# Patient Record
Sex: Male | Born: 1995 | Race: White | Hispanic: No | Marital: Single
Health system: Southern US, Community
[De-identification: ages and names within clinical notes are randomized; demographics above are authoritative.]

---
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
## 2015-07-16 ENCOUNTER — Ambulatory Visit
Admission: RE | Admit: 2015-07-16 | Discharge: 2015-07-16 | Disposition: A | Discharge status: Home / Self Care | Payer: Managed Care, Other (non HMO) | Source: Ambulatory Visit | Attending: Family Medicine | Admitting: Family Medicine

## 2015-07-16 ENCOUNTER — Other Ambulatory Visit: Payer: Self-pay | Admitting: Family Medicine

## 2015-07-16 DIAGNOSIS — M79645 Pain in left finger(s): Secondary | ICD-10-CM | POA: Insufficient documentation

## 2015-07-16 DIAGNOSIS — R609 Edema, unspecified: Secondary | ICD-10-CM

## 2015-07-16 DIAGNOSIS — R52 Pain, unspecified: Secondary | ICD-10-CM

## 2015-07-23 DIAGNOSIS — S62301A Unspecified fracture of second metacarpal bone, left hand, initial encounter for closed fracture: Secondary | ICD-10-CM | POA: Diagnosis not present

## 2016-02-15 ENCOUNTER — Emergency Department
Admission: EM | Admit: 2016-02-15 | Discharge: 2016-02-15 | Disposition: A | Discharge status: Home / Self Care | Payer: Managed Care, Other (non HMO) | Attending: Emergency Medicine | Admitting: Emergency Medicine

## 2016-02-15 ENCOUNTER — Emergency Department: Payer: Managed Care, Other (non HMO)

## 2016-02-15 DIAGNOSIS — X501XXA Overexertion from prolonged static or awkward postures, initial encounter: Secondary | ICD-10-CM | POA: Insufficient documentation

## 2016-02-15 DIAGNOSIS — S93402A Sprain of unspecified ligament of left ankle, initial encounter: Secondary | ICD-10-CM

## 2016-02-15 DIAGNOSIS — M25572 Pain in left ankle and joints of left foot: Secondary | ICD-10-CM | POA: Diagnosis present

## 2016-02-15 DIAGNOSIS — Y9362 Activity, american flag or touch football: Secondary | ICD-10-CM | POA: Diagnosis not present

## 2016-02-15 DIAGNOSIS — Y999 Unspecified external cause status: Secondary | ICD-10-CM | POA: Diagnosis not present

## 2016-02-15 DIAGNOSIS — Y929 Unspecified place or not applicable: Secondary | ICD-10-CM | POA: Diagnosis not present

## 2016-02-15 MED ORDER — OXYCODONE-ACETAMINOPHEN 5-325 MG PO TABS
1.0000 | ORAL_TABLET | Freq: Once | ORAL | Status: AC
  Administered 2016-02-15: 1 via ORAL
  Filled 2016-02-15: qty 1

## 2016-02-15 MED ORDER — IBUPROFEN 800 MG PO TABS: 800.0000 mg | ORAL_TABLET | Freq: Three times a day (TID) | ORAL | Status: AC | PRN

## 2016-02-15 MED ORDER — TRAMADOL HCL 50 MG PO TABS: 50.0000 mg | ORAL_TABLET | Freq: Four times a day (QID) | ORAL | Status: AC | PRN

## 2016-02-15 MED ORDER — IBUPROFEN 800 MG PO TABS
800.0000 mg | ORAL_TABLET | Freq: Once | ORAL | Status: AC
  Administered 2016-02-15: 800 mg via ORAL
  Filled 2016-02-15: qty 1

## 2016-02-15 NOTE — ED Notes (Signed)
Playing football and injured left ankle swelling noted

## 2016-02-15 NOTE — Discharge Instructions (Signed)
Wear splint and ambulate with crutches for 3-5 days as needed. Ankle Sprain An ankle sprain is an injury to the strong, fibrous tissues (ligaments) that hold your ankle bones together.  HOME CARE   Put ice on your ankle for 1-2 days or as told by your doctor.  Put ice in a plastic bag.  Place a towel between your skin and the bag.  Leave the ice on for 15-20 minutes at a time, every 2 hours while you are awake.  Only take medicine as told by your doctor.  Raise (elevate) your injured ankle above the level of your heart as much as possible for 2-3 days.  Use crutches if your doctor tells you to. Slowly put your own weight on the affected ankle. Use the crutches until you can walk without pain.  If you have a plaster splint:  Do not rest it on anything harder than a pillow for 24 hours.  Do not put weight on it.  Do not get it wet.  Take it off to shower or bathe.  If given, use an elastic wrap or support stocking for support. Take the wrap off if your toes lose feeling (numb), tingle, or turn cold or blue.  If you have an air splint:  Add or let out air to make it comfortable.  Take it off at night and to shower and bathe.  Wiggle your toes and move your ankle up and down often while you are wearing it. GET HELP IF:  You have rapidly increasing bruising or puffiness (swelling).  Your toes feel very cold.  You lose feeling in your foot.  Your medicine does not help your pain. GET HELP RIGHT AWAY IF:   Your toes lose feeling (numb) or turn blue.  You have severe pain that is increasing. MAKE SURE YOU:   Understand these instructions.  Will watch your condition.  Will get help right away if you are not doing well or get worse.   This information is not intended to replace advice given to you by your health care provider. Make sure you discuss any questions you have with your health care provider.   Document Released: 04/13/2008 Document Revised: 11/16/2014  Document Reviewed: 05/09/2012 Elsevier Interactive Patient Education [employer]2016 [employer] Inc[employer]

## 2016-02-15 NOTE — ED Provider Notes (Signed)
[facility] Emergency Department Provider Note  ____________________________________________  Time seen: Approximately 7:46 PM  I have reviewed the triage vital signs and the nursing notes.   HISTORY  Chief Complaint Ankle Pain    HPI Michael Boyer is a 20 y.o. male patient complaining of left ankle pain and edema secondary plan football. Patient states a twisting incident resulting in immediate pain and edema. Patient states pain increase with weightbearing. No palliative measures around the triage which time he was given an ice pack. No past medical history on file.  There are no active problems to display for this patient.   No past surgical history on file.  Current Outpatient Rx  Name  Route  Sig  Dispense  Refill  . ibuprofen (ADVIL,MOTRIN) 800 MG tablet   Oral   Take 1 tablet (800 mg total) by mouth every 8 (eight) hours as needed.   30 tablet   0   . traMADol (ULTRAM) 50 MG tablet   Oral   Take 1 tablet (50 mg total) by mouth every 6 (six) hours as needed.   20 tablet   0     Allergies Review of patient's allergies indicates no known allergies.  No family history on file.  Social History Social History  Substance Use Topics  . Smoking status: Not on file  . Smokeless tobacco: Not on file  . Alcohol Use: Not on file    Review of Systems Constitutional: No fever/chills Eyes: No visual changes. ENT: No sore throat. Cardiovascular: Denies chest pain. Respiratory: Denies shortness of breath. Gastrointestinal: No abdominal pain.  No nausea, no vomiting.  No diarrhea.  No constipation. Genitourinary: Negative for dysuria. Musculoskeletal: Left lateral ankle pain and edema  Skin: Negative for rash. Neurological: Negative for headaches, focal weakness or numbness.    ____________________________________________   PHYSICAL EXAM:  VITAL SIGNS: ED Triage Vitals  Enc Vitals Group     BP 02/15/16 1852 120/69 mmHg     Pulse  Rate 02/15/16 1852 80     Resp 02/15/16 1852 18     Temp 02/15/16 1852 98.3 F (36.8 C)     Temp Source 02/15/16 1852 Oral     SpO2 02/15/16 1852 98 %     Weight 02/15/16 1852 210 lb (95.255 kg)     Height 02/15/16 1852 6\' 2"  (1.88 m)     Head Cir --      Peak Flow --      Pain Score 02/15/16 1855 7     Pain Loc --      Pain Edu? --      Excl. in GC? --     Constitutional: Alert and oriented. Well appearing and in no acute distress. Eyes: Conjunctivae are normal. PERRL. EOMI. Head: Atraumatic. Nose: No congestion/rhinnorhea. Mouth/Throat: Mucous membranes are moist.  Oropharynx non-erythematous. Neck: No stridor.  No cervical spine tenderness to palpation. Hematological/Lymphatic/Immunilogical: No cervical lymphadenopathy. Cardiovascular: Normal rate, regular rhythm. Grossly normal heart sounds.  Good peripheral circulation. Respiratory: Normal respiratory effort.  No retractions. Lungs CTAB. Gastrointestinal: Soft and nontender. No distention. No abdominal bruits. No CVA tenderness. Musculoskeletal: No deformities to the left ankle. Moderate edema. Moderate guarding palpation of lateral malleolus. Neurovascular intact. Decreased range of motion with inversion movements. Patient weight-bear with difficulty.  Neurologic:  Normal speech and language. No gross focal neurologic deficits are appreciated. No gait instability. Skin:  Skin is warm, dry and intact. No rash noted. Psychiatric: Mood and affect are normal. Speech and  behavior are normal.  ____________________________________________   LABS (all labs ordered are listed, but only abnormal results are displayed)  Labs Reviewed - No data to display ____________________________________________  EKG   ____________________________________________  RADIOLOGY   ____________________________________________   PROCEDURES  Procedure(s) performed: None  Critical Care performed:  No  ____________________________________________   INITIAL IMPRESSION / ASSESSMENT AND PLAN / ED COURSE  Pertinent labs & imaging results that were available during my care of the patient were reviewed by me and considered in my medical decision making (see chart for details).  Left lateral ankle sprain. Patient given discharge care instructions. Patient placed in a posterior ankle splint and given crutches for ambulation.Patient had a prescription for tramadol and ibuprofen. Patient advised to follow-up with urgent care [facility] clinic if no improvement in 3-5 days. ____________________________________________   FINAL CLINICAL IMPRESSION(S) / ED DIAGNOSES  Final diagnoses:  Left ankle sprain, initial encounter      Joni Reining, PA-C 02/15/16 2001  Emily Filbert, MD 02/15/16 [phone]

## 2016-05-11 IMAGING — CR DG FINGER INDEX 2+V*L*
1 series · 3 of 3 positions shown · non-contrast
Comparison: None.

CLINICAL DATA: Left index finger pain, swelling and limited range
of motion since a football injury 3 weeks ago. Initial encounter.

EXAM:
LEFT INDEX FINGER 2+V

[Series 1: dg finger index left · 0.14mm/px · 3 of 3 slices shown]
[im 1/3]
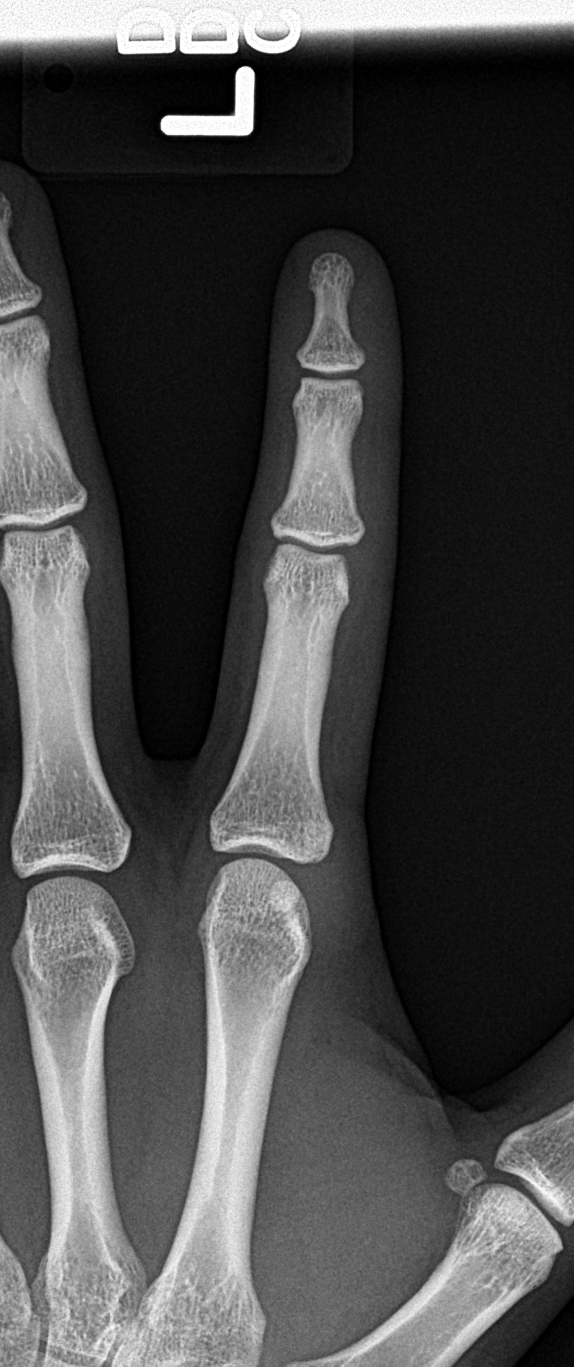
[im 2/3]
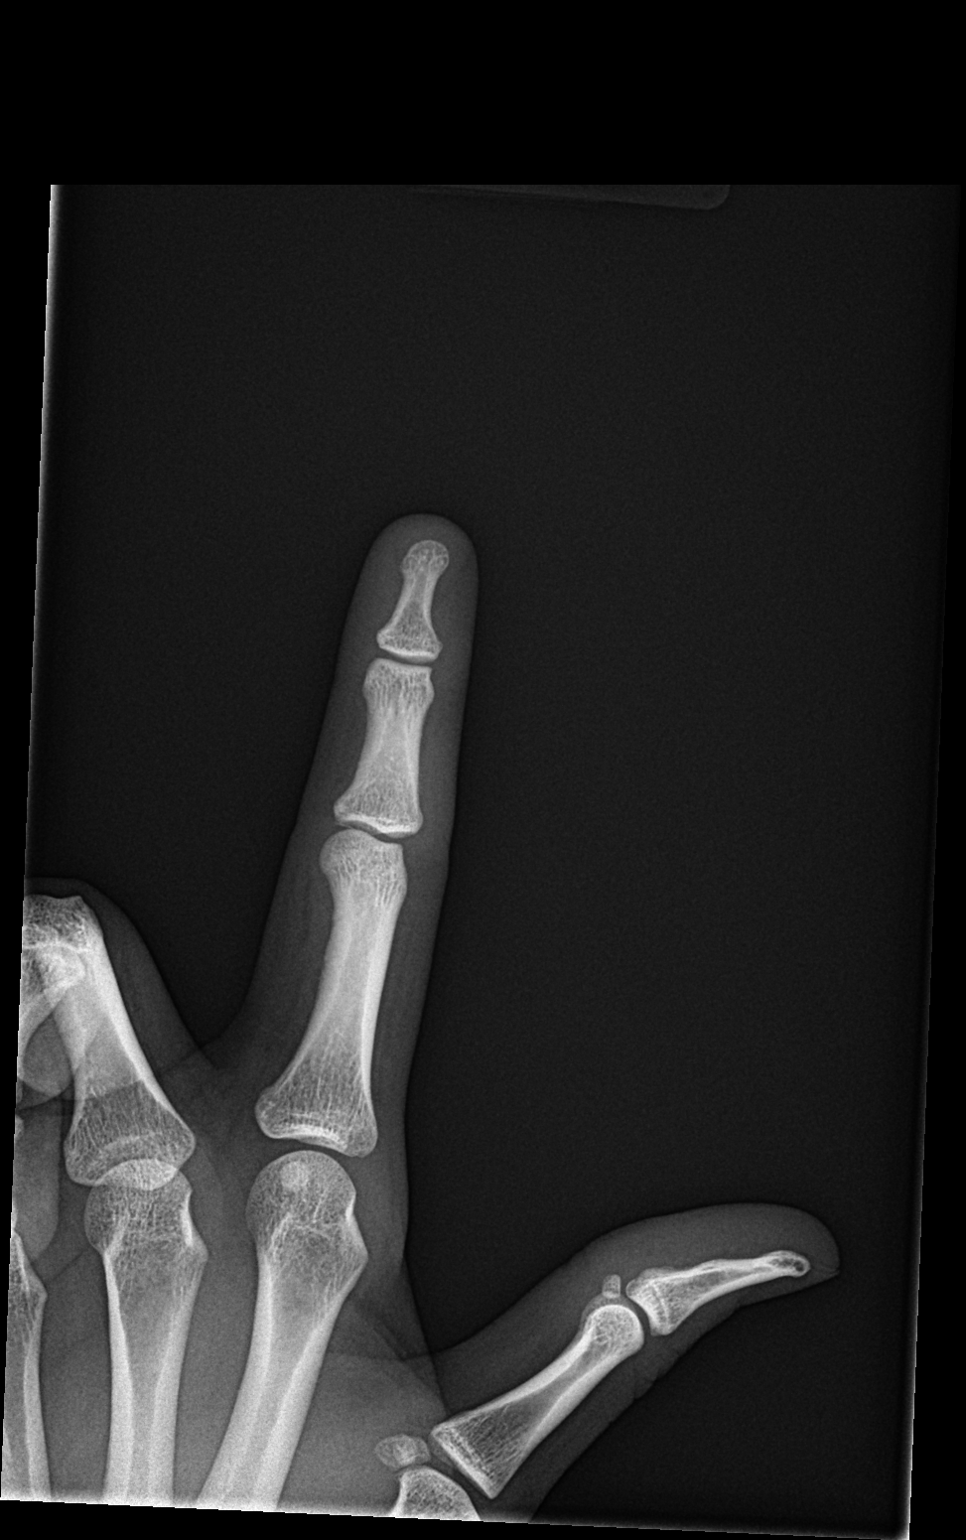
[im 3/3]
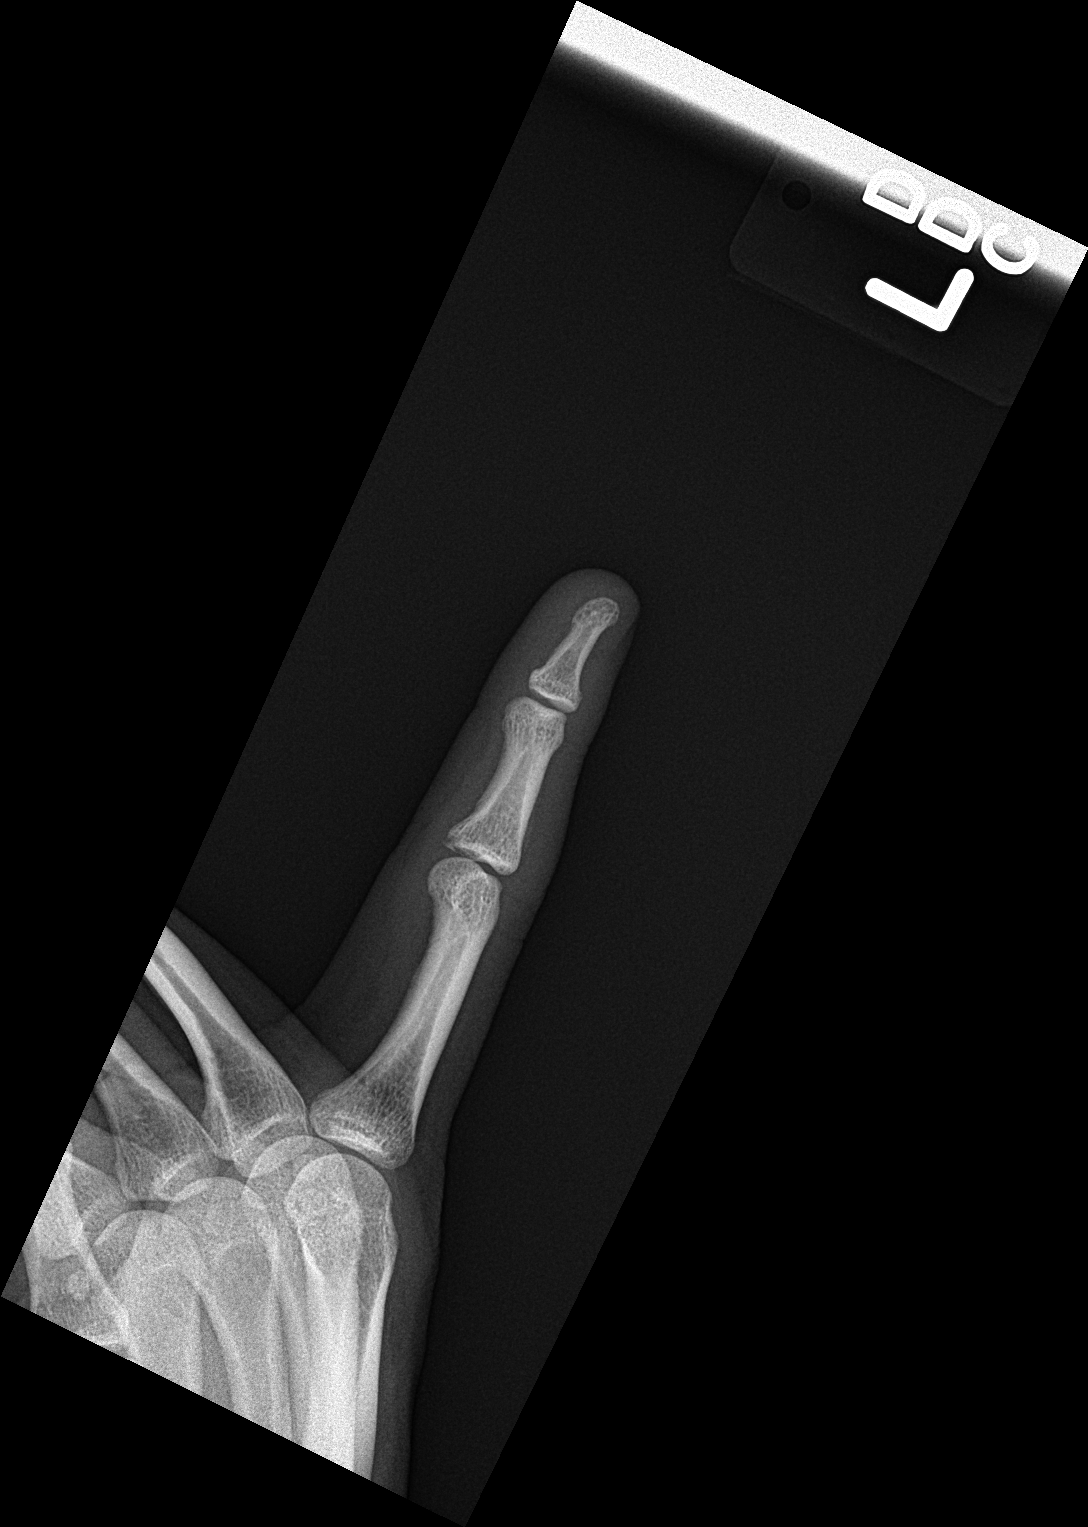

[3 of 3 positions shown; findings below may reference images not displayed]

FINDINGS: There is cortical irregularity at the volar plate of the middle
phalanx of the index finger worrisome for nondisplaced avulsion
fracture. This is not well seen due to nonstandard positioning.
IMPRESSION: Likely nondisplaced a volar plate avulsion fracture middle phalanx
of the left index finger.

## 2016-12-11 IMAGING — CR DG ANKLE COMPLETE 3+V*L*
3 series · 3 of 3 positions shown · non-contrast
Comparison: None.

CLINICAL DATA: Ankle injury, visible swelling and pain to external
lateral ankle.

EXAM:
LEFT ANKLE COMPLETE - 3+ VIEW

[ankle ap]
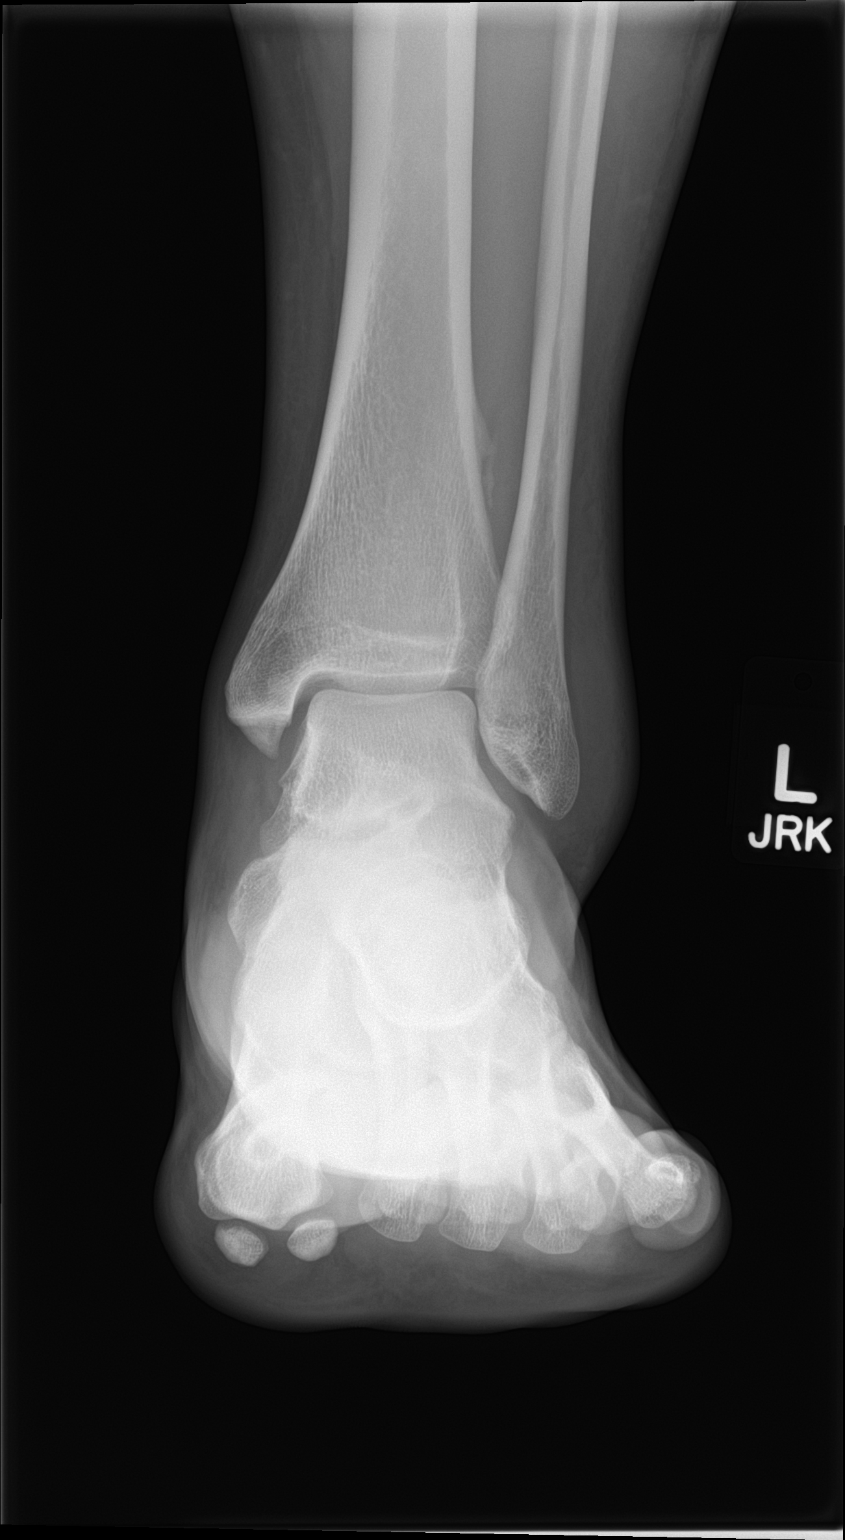

[ankle obl]
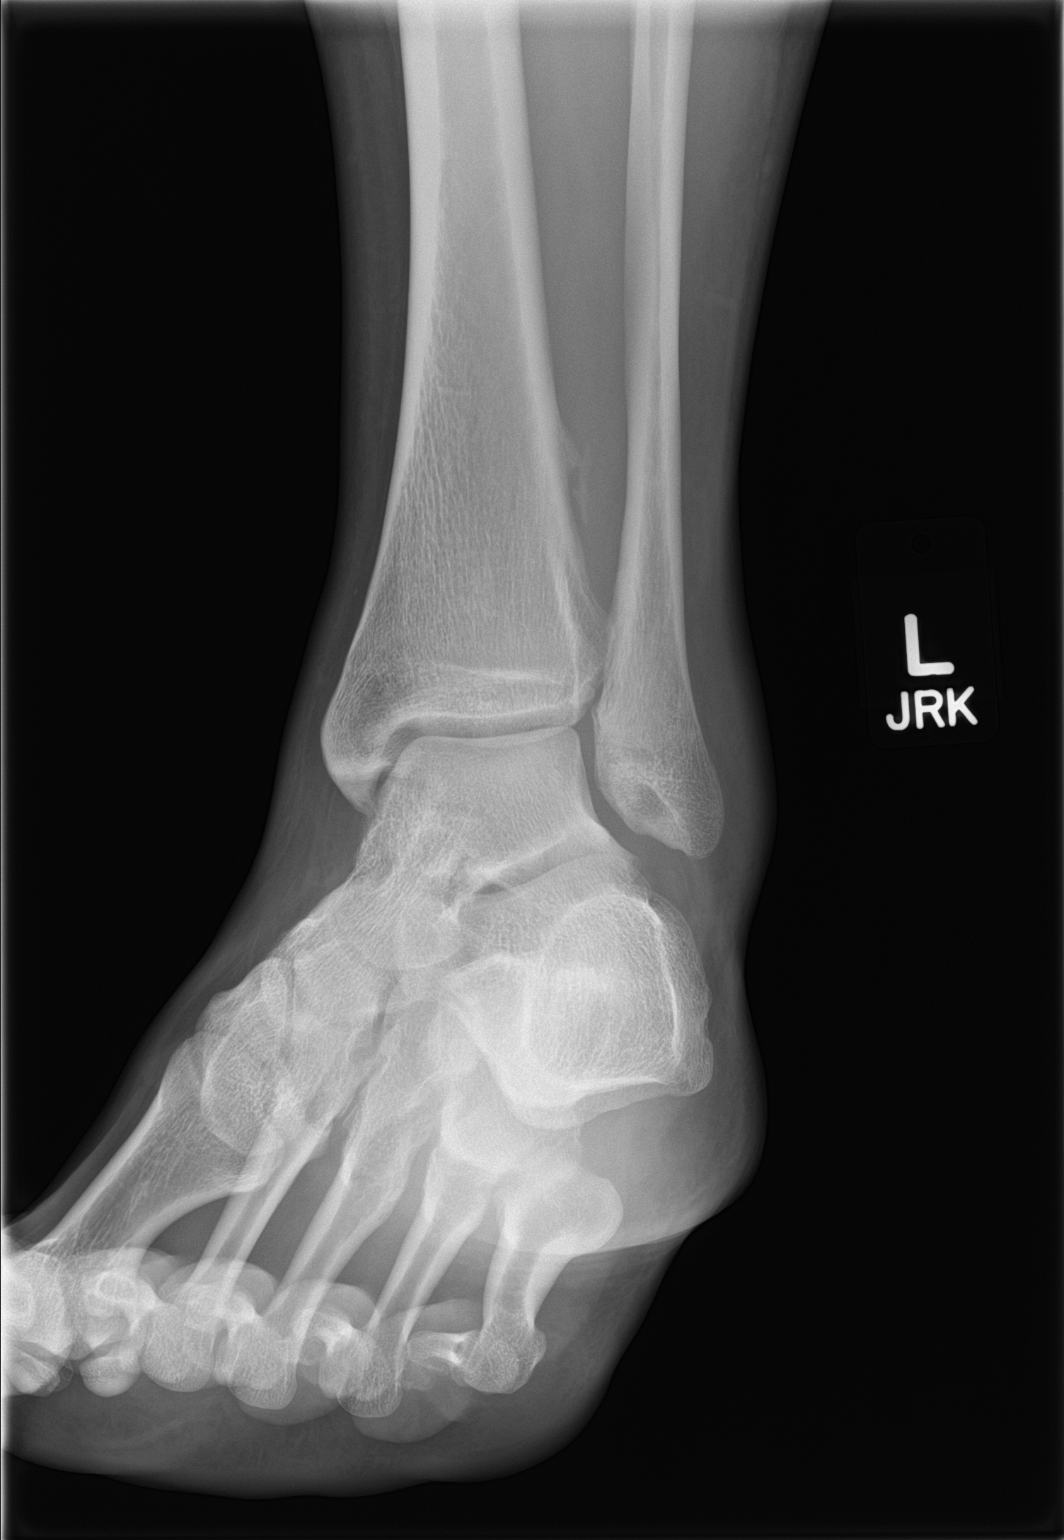

[ankle lat]
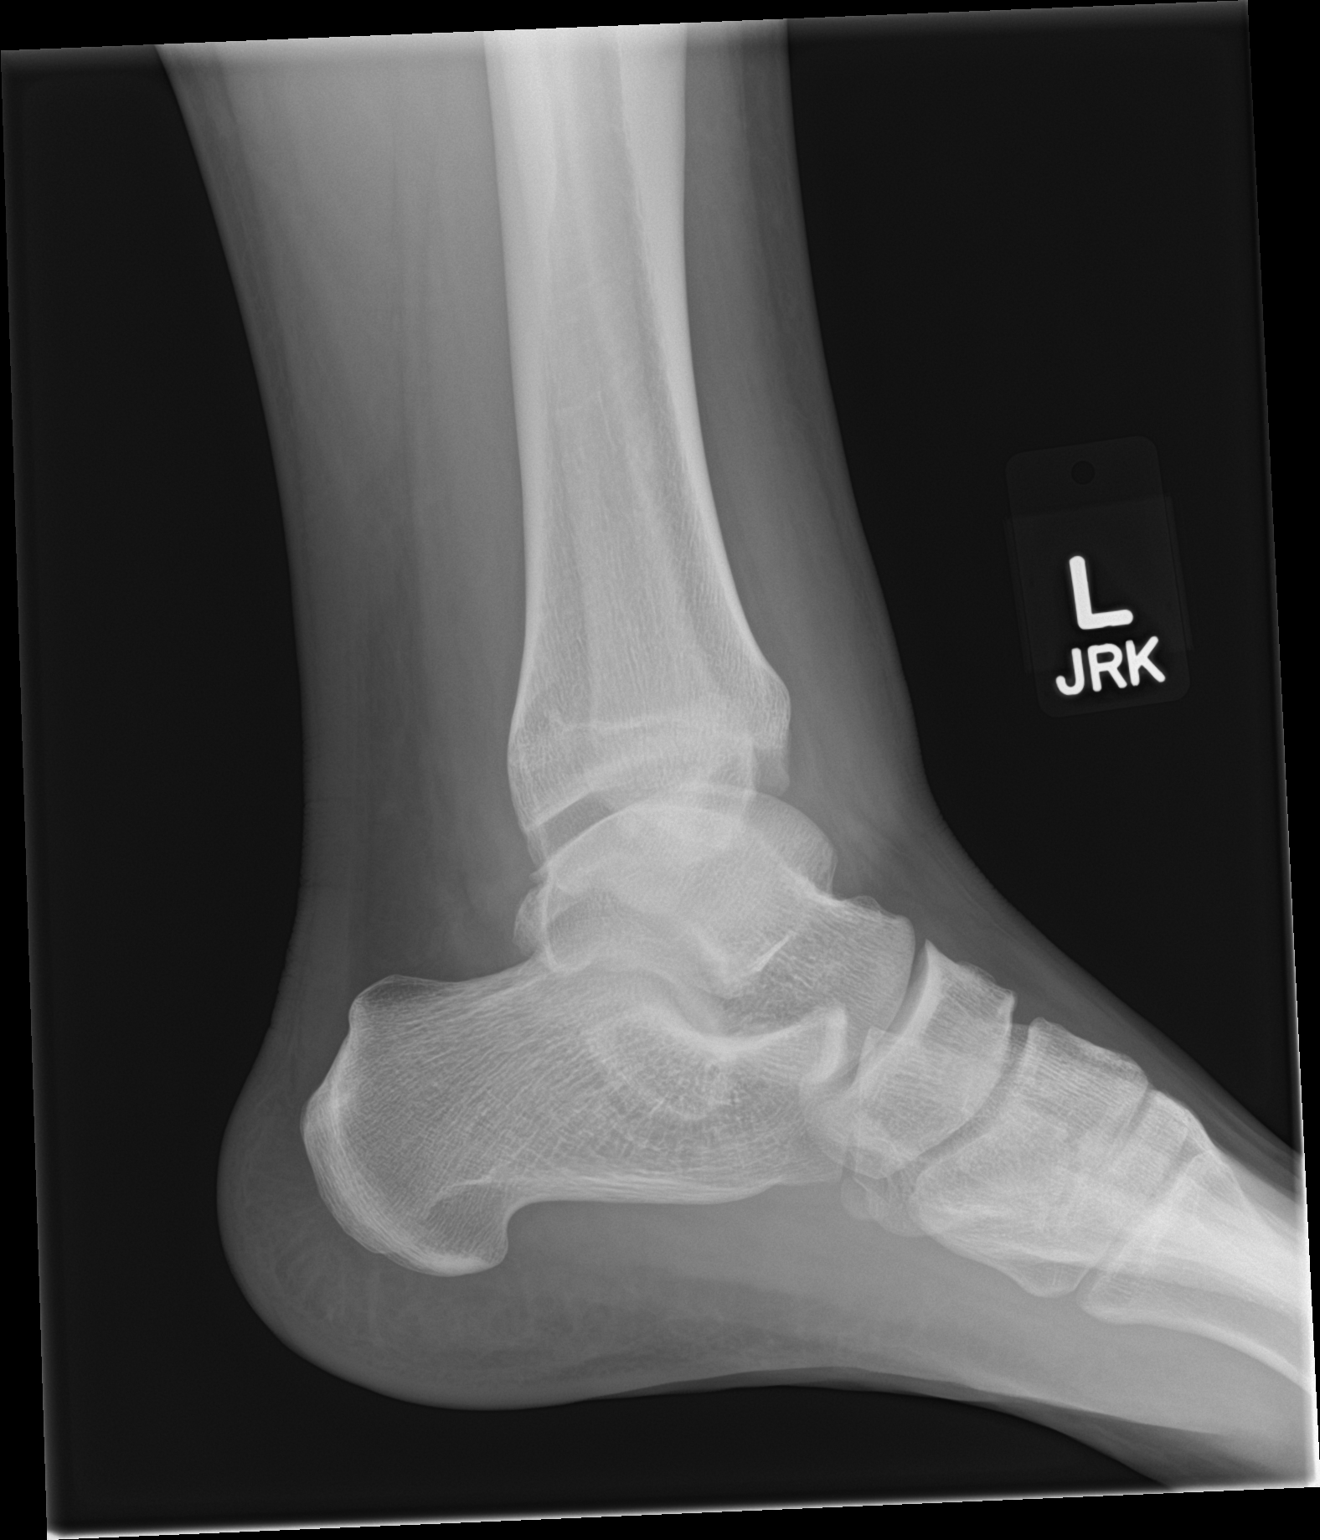

[3 of 3 positions shown; findings below may reference images not displayed]

FINDINGS: Soft tissue swelling overlying the lateral malleolus. No osseous
fracture or dislocation. Ankle mortise is symmetric.
IMPRESSION: Soft tissue swelling.  No osseous fracture or dislocation.

## 2018-12-09 ENCOUNTER — Ambulatory Visit: Admit: 2018-12-09 | Discharge: 2018-12-09 | Payer: PRIVATE HEALTH INSURANCE

## 2018-12-09 DIAGNOSIS — M7551 Bursitis of right shoulder: Secondary | ICD-10-CM

## 2018-12-09 DIAGNOSIS — M7581 Other shoulder lesions, right shoulder: Secondary | ICD-10-CM

## 2018-12-09 NOTE — Progress Notes (Signed)
DOS: 12/09/2018    Chief Complaint   Patient presents with    Shoulder Pain       Subjective:     Carl Mason is a 23 y.o.right hand dominant male who presents today with right shoulder pain.    Goes by Carl Mason'    History of Present Illness  The patient presents with right shoulder pain that originally started in October 2019. He correlates onset of symptoms with working out, he does dips, push ups and pull ups.He has not had physical therapy, acupuncture, chiropractic treatment, massage therapy, diagnostic imaging studies and injections. He rested for some time, but then the pain returned. He also notes an episode where he had sudden pain when pushing off the bed to get up in the morning.     Current Symptoms  At the present time, he complains of anterior and superior right shoulder pain. The pain is described as sharp and is rated 7/10 in severity. The pain is aggravated by sudden movements, push ups, overhead throwing motion. The pain is alleviated by rest. He has mechanical symptoms of clicking and catching. He denies locking, instability and grinding. He states that he has always had clicking in his right shoulder.   Pain does not radiate.    He denies numbness and tingling of the upper extremities.    PAST MEDICAL HISTORY  History reviewed. No pertinent past medical history.   Healthy    PAST SURGICAL HISTORY  History reviewed. No pertinent surgical history.   None    ALLERGIES  Patient has no allergy information on record.   NKDA    MEDICATIONS  No current outpatient medications on file.     No current facility-administered medications for this visit.    None    SOCIAL HISTORY  Social History     Occupational History    Not on file   Tobacco Use    Smoking status: Never Smoker   Substance and Sexual Activity    Alcohol use: Yes    Drug use: Never    Sexual activity: Not on file       FAMILY HISTORY  History reviewed. No pertinent family history.    REVIEW OF SYSTEMS  Review of Systems   Constitutional:  Negative.    HENT: Negative.    Eyes: Negative.    Respiratory: Negative for shortness of breath.    Cardiovascular: Negative for chest pain.   Gastrointestinal: Negative.    Genitourinary: Negative.    Musculoskeletal: Positive for joint pain and myalgias.   Skin: Negative.    Neurological: Negative.    Endo/Heme/Allergies: Negative.    Psychiatric/Behavioral: Negative.      All other systems were reviewed and are negative.    Objective:     PHYSICAL EXAM:  BP 122/64   Pulse 73   Temp 36.4 C (97.6 F)   Ht 188 cm (6\' 2" )   Wt 93 kg (205 lb)   BMI 26.32 kg/m     Physical Exam  Constitutional: The patient is oriented to person, place, and time. The patient appears well-developed and well-nourished.   Eyes: Conjunctivae are normal. No scleral icterus.   Neck: Normal range of motion.   Pulmonary/Chest: Effort normal. No respiratory distress.   Neurological: They are alert and oriented to person, place, and time.   Skin: Skin is warm and dry. No rash noted. No erythema. No pallor.   Psychiatric: They have  normal mood and affect. Their behavior is normal.  Immune/Lymph: No lymphedema noted.   Cardiovascular: Normal rhythm   Gastrointestinal: No abdominal swelling noted.     Ortho Exam - Right Shoulder  No winging of the scapula, no muscle atrophy or asymmetry.     No tenderness over the anterior shoulder, lateral shoulder, posterior shoulder and superior shoulder/AC joint area.  The Cross Arm test is negative.   Empty Can is positive.    Impingement Tests: Neers impingement sign is negative.      Hawkins impingement sign is positive. Mild    Biceps Test:  The Yergason's test is negative.    Labral Tests:  Speed's Test is positive.      O'Brien's Test is positive.       The Crank test is positive .    Strength:   Forward Flexion 5-/5 with pain      Abduction 5-/5 with pain      External Rotation 5/5 with no pain      Internal Rotation 5/5 with pain      Belly Press 5/5 with no pain      Lift Off 5-/5 with  pain      Resisted Biceps Flexion 5/5 with no pain    Range of Motion:  Forward Flexion 170 with mild pain      Abduction 170 with mild pain      External 70       Internal T10    Shoulder Instability: Deferred.    Cervical Exam:  Deferred.    IMAGING:  None available for review.    REVIEW OF OUTSIDE RECORDS:    None available for review.    Assessment:     Carl Mason is a 23 y.o. year old male with right shoulder pain due to RTC tendonitis, bursitis, possible impingement, possible SLAP/labrum tear.    Plan:   The natural history of the problem was discussed with the patient, as well as different treatment options. At this time I am recommending that he start with physical therapy. A prescription was given today. If he does not improve after about 6 weeks of physical therapy, I will order an MRI arthrogram and an x-ray series to further evaluate and treat the right shoulder.     If he is improving significantly with time, and he is able to return to his activities such as weightlifting and volleyball, then he can follow up as needed.     Follow Up: Imaging review or PRN    I, Ardulan Judieth Keens am acting as a Neurosurgeon for services provided by Eyvonne Mechanic, MD on 12/09/2018 9:41 AM  The above scribed documentation as annotated by me accurately reflects the services I have provided.   Eyvonne Mechanic, MD  12/17/2018 3:49 PM    Cheri Guppy, M.D.
